# Patient Record
Sex: Male | Born: 2005 | Race: White | Hispanic: No | Marital: Single | Smoking: Never smoker
Health system: Southern US, Community
[De-identification: ages and names within clinical notes are randomized; demographics above are authoritative.]

---
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
## 2024-03-22 ENCOUNTER — Ambulatory Visit: Payer: Self-pay
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
## 2024-03-23 ENCOUNTER — Ambulatory Visit
Admission: RE | Admit: 2024-03-23 | Discharge: 2024-03-23 | Disposition: A | Discharge status: Home / Self Care | Source: Ambulatory Visit | Attending: Emergency Medicine | Admitting: Emergency Medicine

## 2024-03-23 VITALS — BP 119/79 | HR 73 | Temp 98.0°F | Resp 16

## 2024-03-23 DIAGNOSIS — M545 Low back pain, unspecified: Secondary | ICD-10-CM | POA: Diagnosis not present

## 2024-03-23 MED ORDER — PREDNISONE 10 MG (21) PO TBPK: ORAL_TABLET | Freq: Every day | ORAL | 0 refills | Status: AC

## 2024-03-23 MED ORDER — CYCLOBENZAPRINE HCL 10 MG PO TABS: 10.0000 mg | ORAL_TABLET | Freq: Every day | ORAL | 0 refills | Status: AC

## 2024-03-23 NOTE — Discharge Instructions (Addendum)
 Your pain is most likely caused by irritation to the muscles.  There is no tenderness over the spine  Begin prednisone every morning with food as directed to reduce inflammation and help reduce pain, may take Tylenol  or any topical medicines in addition to this  May use muscle relaxant at bedtime as needed for additional comfort  You may use heating pad in 15 minute intervals as needed for additional comfort  Begin massaging and stretching affected area daily for 10 minutes as tolerated to further loosen muscles   When sitting and lying down place pillow underneath and between knees for support  Can try sleeping without pillow on firm mattress   Practice good posture: head back, shoulders back, chest forward, pelvis back and weight distributed evenly on both legs  If pain persist after recommended treatment or reoccurs if may be beneficial to follow up with orthopedic specialist for evaluation, this doctor specializes in the bones and can manage your symptoms long-term with options such as but not limited to imaging, medications or physical therapy

## 2024-03-23 NOTE — ED Triage Notes (Signed)
 Pt states he is having pain in his lower back x 4 days. Pt states the pain is worse with movement. Taking tylenol  with no relief of pain.   No recent injuries or falls.

## 2024-03-23 NOTE — ED Provider Notes (Signed)
 Randall Clay    CSN: [id number] Arrival date & time: 03/23/24  0909      History   Chief Complaint Chief Complaint  Patient presents with   Back Pain    HPI Randall Clay Stable Hem is a 18 y.o. male.   Patient presents for evaluation of left immediate lower back pain present for 4 days.  Started abruptly after sitting up, no injury or trauma or change in activity.  Has been constant fluctuating in intensity, described as a stabbing pain.  Has attempted use of Tylenol  which has been ineffective.  History of L1 compression fracture.  Denies numbness or tingling, urinary or bowel incontinence.  History reviewed. No pertinent past medical history.  There are no active problems to display for this patient.   History reviewed. No pertinent surgical history.     Home Medications    Prior to Admission medications   Medication Sig Start Date End Date Taking? Authorizing Provider  cyclobenzaprine (FLEXERIL) 10 MG tablet Take 1 tablet (10 mg total) by mouth at bedtime. 03/23/24  Yes Peyton Rossner R, NP  predniSONE (STERAPRED UNI-PAK 21 TAB) 10 MG (21) TBPK tablet Take by mouth daily. Take 6 tabs by mouth daily  for 1 days, then 5 tabs for 1 days, then 4 tabs for 1 days, then 3 tabs for 1 days, 2 tabs for 1 days, then 1 tab by mouth daily for 1 days 03/23/24  Yes Reena Canning, NP    Family History History reviewed. No pertinent family history.  Social History Social History   Tobacco Use   Smoking status: Never   Smokeless tobacco: Never  Vaping Use   Vaping status: Never Used  Substance Use Topics   Alcohol use: Never   Drug use: Never     Allergies   Patient has no known allergies.   Review of Systems Review of Systems   Physical Exam Triage Vital Signs ED Triage Vitals  Encounter Vitals Group     BP 03/23/24 0937 119/79     Girls Systolic BP Percentile --      Girls Diastolic BP Percentile --      Boys Systolic BP Percentile --      Boys Diastolic  BP Percentile --      Pulse Rate 03/23/24 0937 73     Resp 03/23/24 0937 16     Temp 03/23/24 0937 98 F (36.7 C)     Temp Source 03/23/24 0937 Oral     SpO2 03/23/24 0937 96 %     Weight --      Height --      Head Circumference --      Peak Flow --      Pain Score 03/23/24 0921 3     Pain Loc --      Pain Education --      Exclude from Growth Chart --    No data found.  Updated Vital Signs BP 119/79 (BP Location: Left Arm)   Pulse 73   Temp 98 F (36.7 C) (Oral)   Resp 16   SpO2 96%   Visual Acuity Right Eye Distance:   Left Eye Distance:   Bilateral Distance:    Right Eye Near:   Left Eye Near:    Bilateral Near:     Physical Exam Constitutional:      Appearance: Normal appearance.   Eyes:     Extraocular Movements: Extraocular movements intact.   Pulmonary:  Effort: Pulmonary effort is normal.   Musculoskeletal:     Comments: Tenderness to the left lower lumbar region, no ecchymosis swelling or deformity, able to sit erect without complication, negative straight leg test, able to twist turn and bend   Neurological:     Mental Status: He is alert and oriented to person, place, and time. Mental status is at baseline.      UC Treatments / Results  Labs (all labs ordered are listed, but only abnormal results are displayed) Labs Reviewed - No data to display  EKG   Radiology No results found.  Procedures Procedures (including critical care time)  Medications Ordered in UC Medications - No data to display  Initial Impression / Assessment and Plan / UC Course  I have reviewed the triage vital signs and the nursing notes.  Pertinent labs & imaging results that were available during my care of the patient were reviewed by me and considered in my medical decision making (see chart for details).   Acute left-sided low back pain without sciatica  Etiology most likely muscular, no spinal tenderness on exam, no new injury therefore imaging  deferred, patient in agreement with plan, declined Toradol IM, prescribed prednisone and Flexeril for home use recommended RICE, heat massage stretching with activity as tolerated and advised follow-up with orthopedics if symptoms continue to persist Final Clinical Impressions(s) / UC Diagnoses   Final diagnoses:  Acute left-sided low back pain without sciatica     Discharge Instructions      Your pain is most likely caused by irritation to the muscles.  There is no tenderness over the spine  Begin prednisone every morning with food as directed to reduce inflammation and help reduce pain, may take Tylenol  or any topical medicines in addition to this  May use muscle relaxant at bedtime as needed for additional comfort  You may use heating pad in 15 minute intervals as needed for additional comfort  Begin massaging and stretching affected area daily for 10 minutes as tolerated to further loosen muscles   When sitting and lying down place pillow underneath and between knees for support  Can try sleeping without pillow on firm mattress   Practice good posture: head back, shoulders back, chest forward, pelvis back and weight distributed evenly on both legs  If pain persist after recommended treatment or reoccurs if may be beneficial to follow up with orthopedic specialist for evaluation, this doctor specializes in the bones and can manage your symptoms long-term with options such as but not limited to imaging, medications or physical therapy      ED Prescriptions     Medication Sig Dispense Auth. Provider   predniSONE (STERAPRED UNI-PAK 21 TAB) 10 MG (21) TBPK tablet Take by mouth daily. Take 6 tabs by mouth daily  for 1 days, then 5 tabs for 1 days, then 4 tabs for 1 days, then 3 tabs for 1 days, 2 tabs for 1 days, then 1 tab by mouth daily for 1 days 21 tablet Lanard Arguijo R, NP   cyclobenzaprine (FLEXERIL) 10 MG tablet Take 1 tablet (10 mg total) by mouth at bedtime. 10 tablet  Eliodoro Gullett R, NP      PDMP not reviewed this encounter.   Reena Canning, NP 03/23/24 1025
